# Patient Record
Sex: Female | Born: 1970 | Race: White | Hispanic: No | Marital: Single
Health system: Southern US, Community
[De-identification: ages and names within clinical notes are randomized; demographics above are authoritative.]

## PROBLEM LIST (undated history)

## (undated) DIAGNOSIS — G43909 Migraine, unspecified, not intractable, without status migrainosus: Secondary | ICD-10-CM

## (undated) DIAGNOSIS — K219 Gastro-esophageal reflux disease without esophagitis: Secondary | ICD-10-CM

## (undated) DIAGNOSIS — R7303 Prediabetes: Secondary | ICD-10-CM

## (undated) HISTORY — PX: TONSILLECTOMY: SUR1361

## (undated) HISTORY — PX: RADICAL HYSTERECTOMY: SHX2283

## (undated) HISTORY — PX: APPENDECTOMY: SHX54

## (undated) HISTORY — PX: FOOT SURGERY: SHX648

## (undated) HISTORY — PX: LUMBAR FUSION: SHX111

---

## 2017-06-11 ENCOUNTER — Encounter (HOSPITAL_COMMUNITY): Payer: Self-pay | Admitting: Emergency Medicine

## 2017-06-11 ENCOUNTER — Emergency Department (HOSPITAL_COMMUNITY)
Admission: EM | Admit: 2017-06-11 | Discharge: 2017-06-11 | Disposition: A | Discharge status: Home / Self Care | Payer: No Typology Code available for payment source | Attending: Emergency Medicine | Admitting: Emergency Medicine

## 2017-06-11 ENCOUNTER — Emergency Department (HOSPITAL_COMMUNITY): Payer: No Typology Code available for payment source

## 2017-06-11 DIAGNOSIS — M549 Dorsalgia, unspecified: Secondary | ICD-10-CM | POA: Diagnosis present

## 2017-06-11 DIAGNOSIS — Y92410 Unspecified street and highway as the place of occurrence of the external cause: Secondary | ICD-10-CM | POA: Insufficient documentation

## 2017-06-11 DIAGNOSIS — Y9389 Activity, other specified: Secondary | ICD-10-CM | POA: Diagnosis not present

## 2017-06-11 DIAGNOSIS — Z9889 Other specified postprocedural states: Secondary | ICD-10-CM

## 2017-06-11 DIAGNOSIS — Y999 Unspecified external cause status: Secondary | ICD-10-CM | POA: Diagnosis not present

## 2017-06-11 HISTORY — DX: Migraine, unspecified, not intractable, without status migrainosus: G43.909

## 2017-06-11 HISTORY — DX: Gastro-esophageal reflux disease without esophagitis: K21.9

## 2017-06-11 HISTORY — DX: Prediabetes: R73.03
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
## 2017-06-11 NOTE — Discharge Instructions (Signed)
Your xrays were reassuring. Please follow up with your surgeon in the next week. If you develop worsening or new concerning symptoms you can return to the emergency department for re-evaluation.
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
## 2017-06-11 NOTE — ED Notes (Signed)
Patient was alert, oriented and stable upon discharge. RN went over AVS and patient had no further questions.  
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
